# Patient Record
Sex: Male | Born: 1995 | Race: White | Hispanic: No | Marital: Single
Health system: Midwestern US, Community
[De-identification: ages and names within clinical notes are randomized; demographics above are authoritative.]

---
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
## 2014-01-15 MED ORDER — PSEUDOEPHEDRINE-GUAIFENESIN ER 60-600 MG PO TB12
60-600 | ORAL_TABLET | Freq: Two times a day (BID) | ORAL | Status: AC
Start: 2014-01-15 — End: 2014-01-25

## 2014-01-15 MED ORDER — AZITHROMYCIN 250 MG PO TABS
250 | PACK | ORAL | Status: DC
Start: 2014-01-15 — End: 2017-04-06

## 2014-01-15 NOTE — Progress Notes (Signed)
Subjective:      Patient ID: Brandon Huff is a 18 y.o. male.    HPI  Here for eval sxs over the past 2-3 days  Seems to be getting worse  + sinus drainage  + runny nose, stuffy nose  + h/a  + generalized weakness, tired  + ST  + PND  + sneezing    No cough  No dyspnea  No wheeze  No chest congestion    No ear sxs  No rash  No fever - but feels warm  No chills  Nl appetite    Sick contacts: roommate with bronchitis  Mgmt: Dayquil  + h/o allergies- spring  no h/o asthma  no h/o sinus dz/infection  Non-smoker    Review of Systems   Constitutional: Negative for fever, chills and fatigue.   HENT: Positive for postnasal drip, rhinorrhea, sinus pressure, sneezing and sore throat. Negative for ear pain, hearing loss, nosebleeds, trouble swallowing and voice change.    Eyes: Negative for discharge, redness and itching.   Respiratory: Negative for cough, chest tightness, shortness of breath and wheezing.    Cardiovascular: Negative for chest pain.   Gastrointestinal: Negative for nausea.   Endocrine:        Non-DM   Skin: Negative for rash.   Allergic/Immunologic: Positive for environmental allergies.   Neurological: Positive for headaches. Negative for dizziness and light-headedness.       Objective:   Physical Exam   Constitutional: He is oriented to person, place, and time. Vital signs are normal. He appears well-developed and well-nourished. He is active.  Non-toxic appearance. He appears ill. No distress.   HENT:   Head: Normocephalic and atraumatic.   Right Ear: Tympanic membrane, external ear and ear canal normal.   Left Ear: Tympanic membrane, external ear and ear canal normal.   Nose: Mucosal edema and rhinorrhea present. Right sinus exhibits no maxillary sinus tenderness and no frontal sinus tenderness. Left sinus exhibits no maxillary sinus tenderness and no frontal sinus tenderness.   Mouth/Throat: Uvula is midline. Posterior oropharyngeal erythema present. No oropharyngeal exudate or posterior oropharyngeal edema.    + PND   Eyes: Conjunctivae and lids are normal. No scleral icterus.   Neck: Neck supple. No thyroid mass and no thyromegaly present.   Cardiovascular: Normal rate, regular rhythm and normal heart sounds.    Pulmonary/Chest: Effort normal and breath sounds normal. He has no decreased breath sounds. He has no wheezes. He has no rhonchi. He has no rales.   Lymphadenopathy:     He has no cervical adenopathy.   Neurological: He is alert and oriented to person, place, and time.   Skin: Skin is warm, dry and intact. No rash noted.   Psychiatric: He has a normal mood and affect. His behavior is normal.   Nursing note and vitals reviewed.          Assessment:      1. Viral URI  pseudoephedrine-guaiFENesin (MUCINEX D) 60-600 MG per tablet   2. Acute sinusitis, recurrence not specified, unspecified location  azithromycin (ZITHROMAX Z-PAK) 250 MG tablet    Viral vs early bacterial           Plan:      Nasal saline sprays (ie: [place], [place])  Tylenol and/or ibuprofen prn pain  Antibiotics as prescribed if symptoms worsen or fail to improve           Elianne Gubser T Sarayah Bacchi        Follow up with  PCP in 3-5 days. Call for appointment with your current PCP or please set up to establish with new PCP ASAP for appropriate follow up.

## 2016-11-10 ENCOUNTER — Inpatient Hospital Stay: Admit: 2016-11-10 | Attending: Internal Medicine

## 2016-11-10 NOTE — Other (Unsigned)
Patient Acct Nbr: [id number]   Primary AUTH/CERT:   Primary Insurance Company Name: [employer]  Primary Insurance Plan name: [employer]  Primary Insurance Group Number:   Primary Insurance Plan Type: Doctor, hospitalLiability  Primary Insurance Policy Number: [id number]

## 2016-11-10 NOTE — Other (Unsigned)
Patient Acct Nbr: [id number]   Primary AUTH/CERT:   Primary Insurance Company Name: [employer]  Primary Insurance Plan name: [employer]  Primary Insurance Group Number:   Primary Insurance Plan Type: Doctor, hospitalLiability  Primary Insurance Policy Number: [id number]

## 2017-04-06 ENCOUNTER — Ambulatory Visit: Admit: 2017-04-06 | Discharge: 2017-04-06 | Payer: PRIVATE HEALTH INSURANCE | Attending: Family

## 2017-04-06 DIAGNOSIS — H6123 Impacted cerumen, bilateral: Secondary | ICD-10-CM

## 2017-04-06 MED ORDER — POLYMYXIN B-TRIMETHOPRIM 10000-0.1 UNIT/ML-% OP SOLN
Freq: Four times a day (QID) | OPHTHALMIC | 0 refills | Status: AC
Start: 2017-04-06 — End: 2017-04-13

## 2017-04-06 NOTE — Progress Notes (Signed)
Subjective:     Patient: Brandon Huff is a 21 y.o. male     Eye Problem    The left eye is affected. This is a new problem. The current episode started yesterday. The problem occurs constantly. The problem has been unchanged. There was no injury mechanism. There is no known exposure to pink eye. He does not wear contacts. Associated symptoms include an eye discharge, eye redness and itching. Pertinent negatives include no blurred vision, double vision, fever, foreign body sensation, photophobia, recent URI, vomiting or weakness. He has tried nothing for the symptoms.      Patient also presents to clinic with complaint of left ear fullness and decreased hearing. Patient stated he thinks his left ear may occluded as he has history of cerumen impactions.     Review of Systems   Constitutional: Positive for activity change. Negative for chills, fatigue and fever.   HENT: Positive for ear pain. Negative for congestion, ear discharge, sinus pain, sinus pressure, sore throat and trouble swallowing.    Eyes: Positive for discharge, redness and itching. Negative for blurred vision, double vision, photophobia, pain and visual disturbance.   Gastrointestinal: Negative for vomiting.   Musculoskeletal: Negative for myalgias and neck pain.   Skin: Negative for color change, pallor, rash and wound.   Allergic/Immunologic: Negative for environmental allergies, food allergies and immunocompromised state.   Neurological: Negative for dizziness and weakness.   Psychiatric/Behavioral: Negative for agitation.        No Known Allergies  No current outpatient prescriptions on file prior to visit.     No current facility-administered medications on file prior to visit.       No past medical history on file.   Social History   Substance Use Topics   . Smoking status: Never Smoker   . Smokeless tobacco: Never Used   . Alcohol use Not on file          Objective:     BP 108/73 (Site: Left Upper Arm)   Pulse 60   Temp 99 F (37.2 C)  (Temporal)   Wt 166 lb (75.3 kg)   SpO2 98%   BMI 24.51 kg/m     Physical Exam   Constitutional: He appears well-developed and well-nourished. No distress.   HENT:   Head: Normocephalic and atraumatic.   Right Ear: Hearing and external ear normal.   Left Ear: Decreased hearing is noted.   Left ear- canal is completely occluded and TM cannot be visualized.     Right ear- canal is partially occluded with cerumen, however portion of TM at approximately 10 o'clock is visible and appears normal in appearance.     Patient a candidate for a ear irrigation so that both TM's can be visualized and assess if patient is also suffering from an otitis media infection along with pink eye.     Eyes: Pupils are equal, round, and reactive to light. EOM and lids are normal. Right eye exhibits no discharge, no exudate and no hordeolum. No foreign body present in the right eye. Left eye exhibits discharge. Left eye exhibits no exudate and no hordeolum. No foreign body present in the left eye. Right conjunctiva is not injected. Right conjunctiva has no hemorrhage. Left conjunctiva is injected. Left conjunctiva has no hemorrhage. No scleral icterus.   Skin: He is not diaphoretic.   Nursing note and vitals reviewed.        Assessment      1. Bilateral impacted cerumen  2. Acute conjunctivitis of left eye, unspecified acute conjunctivitis type         Plan      1. Bilateral impacted cerumen    2. Acute conjunctivitis of left eye, unspecified acute conjunctivitis type  - trimethoprim-polymyxin b (POLYTRIM) 10000-0.1 UNIT/ML-% ophthalmic solution; Place 1 drop into the left eye every 6 hours for 7 days  Dispense: 1 Bottle; Refill: 0    Plan of care for this patient is to treat for cerumen impaction. After irrigation both TM's completely visualized. No signs of infection noted. Patient tolerated well and indicated his ear pain and fullness has resolved entirely. Patient will also be treated for conjunctivitis.    Patient will be given  script for polytrim eye drops.   Patient advised to drink fluids, get rest and take all meds as prescribed. Patient advised if symptoms worsen or persist, they are to follow up with PCP. Patient agreeable with treatment plan.       Roswell MinersJoseph Meloney Feld, APRN - CNP  04/06/17  4:41 PM        If symptoms do not improve, worsen, or new symptoms develop, see PCP for further evaluation.

## 2017-04-06 NOTE — Patient Instructions (Signed)
Patient Education        Pinkeye: Care Instructions  Your Care Instructions    Pinkeye is redness and swelling of the eye surface and the conjunctiva (the lining of the eyelid and the covering of the white part of the eye). Pinkeye is also called conjunctivitis. Pinkeye is often caused by infection with bacteria or a virus. Dry air, allergies, smoke, and chemicals are other common causes.  Pinkeye often clears on its own in 7 to 10 days. Antibiotics only help if the pinkeye is caused by bacteria. Pinkeye caused by infection spreads easily. If an allergy or chemical is causing pinkeye, it will not go away unless you can avoid whatever is causing it.  Follow-up care is a key part of your treatment and safety. Be sure to make and go to all appointments, and call your doctor if you are having problems. It's also a good idea to know your test results and keep a list of the medicines you take.  How can you care for yourself at home?   Wash your hands often. Always wash them before and after you treat pinkeye or touch your eyes or face.   Use moist cotton or a clean, wet cloth to remove crust. Wipe from the inside corner of the eye to the outside. Use a clean part of the cloth for each wipe.   Put cold or warm wet cloths on your eye a few times a day if the eye hurts.   Do not wear contact lenses or eye makeup until the pinkeye is gone. Throw away any eye makeup you were using when you got pinkeye. Clean your contacts and storage case. If you wear disposable contacts, use a new pair when your eye has cleared and it is safe to wear contacts again.   If the doctor gave you antibiotic ointment or eyedrops, use them as directed. Use the medicine for as long as instructed, even if your eye starts looking better soon. Keep the bottle tip clean, and do not let it touch the eye area.   To put in eyedrops or ointment:  ? Tilt your head back, and pull your lower eyelid down with one finger.  ? Drop or squirt the medicine  inside the lower lid.  ? Close your eye for 30 to 60 seconds to let the drops or ointment move around.  ? Do not touch the ointment or dropper tip to your eyelashes or any other surface.   Do not share towels, pillows, or washcloths while you have pinkeye.  When should you call for help?  Call your doctor now or seek immediate medical care if:    You have pain in your eye, not just irritation on the surface.     You have a change in vision or loss of vision.     You have an increase in discharge from the eye.     Your eye has not started to improve or begins to get worse within 48 hours after you start using antibiotics.     Pinkeye lasts longer than 7 days.   Watch closely for changes in your health, and be sure to contact your doctor if you have any problems.  Where can you learn more?  Go to [email or web address] and sign in to your MyChart account. Enter Y392 in the Search Health Information box to learn more about "Pinkeye: Care Instructions."     If you do not have an account, please click on the "Sign   Up Now" link.  Current as of: March 08, 2016  Content Version: 11.8   2006-2018 [employer]. Care instructions adapted under license by [facility]. If you have questions about a medical condition or this instruction, always ask your healthcare professional. [employer] disclaims any warranty or liability for your use of this information.         Patient Education        Earwax Blockage: Care Instructions  Your Care Instructions    Earwax is a natural substance that protects the ear canal. Normally, earwax drains from the ears and does not cause problems. Sometimes earwax builds up and hardens. Earwax blockage (also called cerumen impaction) can cause some loss of hearing and pain. When wax is tightly packed, you will need to have your doctor remove it.  Follow-up care is a key part of your treatment and safety. Be sure to make and go to all  appointments, and call your doctor if you are having problems. It's also a good idea to know your test results and keep a list of the medicines you take.  How can you care for yourself at home?   Do not try to remove earwax with cotton swabs, fingers, or other objects. This can make the blockage worse and damage the eardrum.   If your doctor recommends that you try to remove earwax at home:  ? Soften and loosen the earwax with warm mineral oil. You also can try hydrogen peroxide mixed with an equal amount of room temperature water. Place 2 drops of the fluid, warmed to body temperature, in the ear two times a day for up to 5 days.  ? Once the wax is loose and soft, all that is usually needed to remove it from the ear canal is a gentle, warm shower. Direct the water into the ear, then tip your head to let the earwax drain out. Dry your ear thoroughly with a hair dryer set on low. Hold the dryer several inches from your ear.  ? If the warm mineral oil and shower do not work, use an over-the-counter wax softener. Read and follow all instructions on the label. After using the wax softener, use an ear syringe to gently flush the ear. Make sure the flushing solution is body temperature. Cool or hot fluids in the ear can cause dizziness.  When should you call for help?  Call your doctor now or seek immediate medical care if:    Pus or blood drains from your ear.     Your ears are ringing or feel full.     You have a loss of hearing.   Watch closely for changes in your health, and be sure to contact your doctor if:    You have pain or reduced hearing after 1 week of home treatment.     You have any new symptoms, such as nausea or balance problems.   Where can you learn more?  Go to [email or web address] and sign in to your MyChart account. Enter [id number] in the Search Health Information box to learn more about "Earwax Blockage: Care Instructions."     If you do not have an account, please click on  the "Sign Up Now" link.  Current as of: March 08, 2016  Content Version: 11.8   2006-2018 [employer]. Care instructions adapted under license by [facility]. If you have questions about a medical condition or this instruction, always ask your healthcare professional. [employer] disclaims any warranty or liability  for your use of this information.

## 2019-04-14 DIAGNOSIS — R0789 Other chest pain: Secondary | ICD-10-CM

## 2019-04-14 NOTE — ED Provider Notes (Signed)
Emergency Department Encounter  [facility] EMERGENCY DEPT    Patient: Brandon Huff  MRN: [id number]  DOB: 1995-05-13  Date of Evaluation: 04/14/2019  ED Provider: Richelle Ito, PA-C    As the APP-in-triage, I performed a medical screening history and physical exam on this patient.  An N95 and gloves were worn during the entirety of this encounter.    HISTORY OF PRESENT ILLNESS  In brief, Brandon Huff is a 23 y.o. male that presents for left-sided chest pain that started one day ago.  Patient states he was lying on the couch and felt a sharp, left-sided pain that lasted for a few seconds.  Pain is now dull, intermittent.  Initially attributed to heartburn, however members that he did fall while snowboarding the other day.  Denies shortness of breath, fever, chills, cough.  No cardiac or lung history.  No history of blood clots.      PHYSICAL EXAM  ED Triage Vitals [04/14/19 2023]   Enc Vitals Group      BP       Pulse       Resp 16      Temp       Temp src       SpO2       Weight 170 lb (77.1 kg)      Height 5\' 9"  (1.753 m)      Head Circumference       Peak Flow       Pain Score       Pain Loc       Pain Edu?       Excl. in [place]?        On brief exam, patient alert and oriented ??3, well-appearing began full, clear sentences in no distress.  Heart is regular rate and rhythm.  Lungs are clear to auscultation bilaterally.  Unable to reproduce chest wall pain.      We will initiate diagnostics/treatments as indicated and place in main ED as soon as available.    Darrol Jump)  Richelle Ito, PA-C  [employer]       Luciano Cinquemani, [place]  04/14/19 2138

## 2019-04-14 NOTE — ED Provider Notes (Signed)
[facility] EMERGENCY DEPT  EMERGENCY DEPARTMENT ENCOUNTER      Pt Name: Brandon Huff  MRN: [id number]  Birthdate 03-06-1996  Date of evaluation: 04/14/2019  Provider: Elmer Picker, PA-C     CHIEF COMPLAINT       Chief Complaint   Patient presents with   ??? Chest Pain     pt c/o left sided chest pressure x 1 day, states it felt like heartburn but is not improving. pt states he did fall while snowboarding on wednesday, pain is not reproducable.          HISTORY OF PRESENT ILLNESS   (Location/Symptom, Timing/Onset, Context/Setting, Quality, Duration, Modifying Factors, Severity) Note limiting factors.   HPI    I wore an N-95 mask with a surgical mask and goggles during my entire encounter.     Brandon Huff is a 23 y.o. male who presents to the emergency department  For evaluation of left-sided chest pressure for one day.  Patient states that yesterday he is lying on the couch and felt a sharp left-sided pain that lasted a few seconds and now has been a dull pain and intermittent since yesterday.  Patient states he thought it was heartburn but also had a mechanical fall while snowboarding on Wednesday.  Patient denies any head injuries or trauma.  Patient has is in consciousness.  Patient has a significant history of lung disease denies tobacco use and marijuana use.  Patient states the pain is not reproducible but has been intermittent since yesterday.  Patient states he has not taken anything for pain or heartburn.   Patient denies significant past medical history.  Patient states that he is overall healthy and works out.  Patient denies family history of heart disease.  Patient denies, chills, lightheadedness, dizziness, shortness of breath, cough, chest tightness, abdominal pain, nausea, vomiting, diarrhea, dysuria, hematuria, numbness or tingling in extremities, rash, or lower extremity edema.    Nursing Notes were reviewed.    REVIEW OF SYSTEMS    (2+ for level 4; 10+ for level 5)   At least 10 systems reviewed and otherwise  acutely negative except as in the HOPI.    PAST MEDICAL HISTORY   History reviewed. No pertinent past medical history.    SURGICAL HISTORY     History reviewed. No pertinent surgical history.    CURRENT MEDICATIONS       Previous Medications    No medications on file       ALLERGIES     Patient has no known allergies.    FAMILY HISTORY     History reviewed. No pertinent family history.     SOCIAL HISTORY       Social History     Socioeconomic History   ??? Marital status: Single     Spouse name: None   ??? Number of children: None   ??? Years of education: None   ??? Highest education level: None   Occupational History   ??? None   Social Needs   ??? Financial resource strain: None   ??? Food insecurity     Worry: None     Inability: None   ??? Transportation needs     Medical: None     Non-medical: None   Tobacco Use   ??? Smoking status: Never Smoker   ??? Smokeless tobacco: Never Used   Substance and Sexual Activity   ??? Alcohol use: Yes     Alcohol/week: 0.0 standard drinks  Comment: social   ??? Drug use: Never   ??? Sexual activity: None   Lifestyle   ??? Physical activity     Days per week: None     Minutes per session: None   ??? Stress: None   Relationships   ??? Social [employer]     Talks on phone: None     Gets together: None     Attends religious service: None     Active member of club or organization: None     Attends meetings of clubs or organizations: None     Relationship status: None   ??? Intimate partner violence     Fear of current or ex partner: None     Emotionally abused: None     Physically abused: None     Forced sexual activity: None   Other Topics Concern   ??? None   Social History Narrative   ??? None       SCREENINGS      Heart Score for chest pain patients  History: Slightly Suspicious  ECG: Normal  Patient Age: < 45 years  Risk Factors: No risk factors known  Troponin: < 1X normal limit  Heart Score Total: 0    PHYSICAL EXAM    (up to 7 for level 4, 8 or more for level 5)     ED Triage Vitals   BP Temp Temp Source  Pulse Resp SpO2 Height Weight   04/14/19 2026 04/14/19 2026 04/14/19 2026 04/14/19 2026 04/14/19 2023 04/14/19 2026 04/14/19 2023 04/14/19 2023   (!) 136/57 98.4 ??F (36.9 ??C) Temporal 58 16 99 % 5\' 9"  (1.753 m) 170 lb (77.1 kg)       Physical Exam  Vitals signs and nursing note reviewed.   Constitutional:       Appearance: Normal appearance.   HENT:      Head: Normocephalic and atraumatic.      Nose: Nose normal. No congestion or rhinorrhea.      Mouth/Throat:      Mouth: Mucous membranes are moist.      Pharynx: Oropharynx is clear. No oropharyngeal exudate or posterior oropharyngeal erythema.   Eyes:      General: No visual field deficit.     Extraocular Movements: Extraocular movements intact.      Conjunctiva/sclera: Conjunctivae normal.      Pupils: Pupils are equal, round, and reactive to light.   Neck:      Musculoskeletal: Normal range of motion and neck supple. No neck rigidity or muscular tenderness.      Vascular: No carotid bruit.   Cardiovascular:      Rate and Rhythm: Regular rhythm. Bradycardia present.      Pulses: Normal pulses.      Heart sounds: Normal heart sounds. No murmur. No friction rub. No gallop.    Pulmonary:      Effort: Pulmonary effort is normal. No respiratory distress.      Breath sounds: Normal breath sounds. No wheezing or rhonchi.   Chest:      Chest wall: No tenderness.   Abdominal:      General: Abdomen is flat. Bowel sounds are normal. There is no distension.      Palpations: Abdomen is soft.      Tenderness: There is no abdominal tenderness. There is no guarding or rebound.   Musculoskeletal: Normal range of motion.         General: No swelling or tenderness.      Right  lower leg: No edema.      Left lower leg: No edema.   Lymphadenopathy:      Cervical: No cervical adenopathy.   Skin:     General: Skin is warm and dry.      Capillary Refill: Capillary refill takes less than 2 seconds.   Neurological:      General: No focal deficit present.      Mental Status: He is alert and  oriented to person, place, and time.      Cranial Nerves: Cranial nerves are intact. No cranial nerve deficit, dysarthria or facial asymmetry.      Sensory: Sensation is intact. No sensory deficit.      Motor: Motor function is intact. No weakness.      Coordination: Coordination is intact. Coordination normal.      Gait: Gait is intact. Gait normal.      Deep Tendon Reflexes: Reflexes are normal and symmetric.      Reflex Scores:       Patellar reflexes are 2+ on the right side and 2+ on the left side.       Achilles reflexes are 2+ on the right side and 2+ on the left side.  Psychiatric:         Mood and Affect: Mood normal.         Behavior: Behavior normal.         Thought Content: Thought content normal.         DIAGNOSTIC RESULTS     EKG:  All EKG's are interpreted by the Emergency Department Physician in the absence of a cardiologist.  Please see their note for interpretation of EKG.    RADIOLOGY (Per Emergency Physician):   Interpretation per the Radiologist below, if available at the time of this note:  Xr Chest (2 Vw)    Result Date: 04/14/2019  Patient Name:                ZYQUAN, CROTTY MRN:                         [id number] Acct#:                       [id number] Diagnostic Radiology ACCESSION                 EXAM DATE/TIME           PROCEDURE                 ORDERING PROVIDER [phone]             04/14/2019 21:08 EST     CR Chest PA & LAT         HAVERCHAK, ALEXANDRA CPT code 25956 Reason For Exam (CR Chest PA & LAT) cp Report CHEST PA AND LATERAL CLINICAL INDICATION: cp TECHNIQUE: Frontal and lateral chest x-ray. COMPARISON: 25 July, 2018. FINDINGS: Cardiac and mediastinal silhouette within normal limits. Lungs are grossly clear. No significant vascular congestion. No apparent pleural effusion or pneumothorax. Bony thorax grossly unremarkable. IMPRESSION: 1. No acute findings. Report Dictated on Workstation: [id number] ---** Final ---** Dictated: 04/14/2019 9:15 pm Dictating Physician: Georgetta Haber, MD,  [facility] Signed Date and Time: 04/14/2019 9:15 pm Signed by: Georgetta Haber, MD, [facility] Transcribed Date and Time: 04/14/2019 9:15      ED BEDSIDE ULTRASOUND:   Performed by ED Physician - none    LABS:  Labs Reviewed - No data  to display     All other labs were within normal range or not returned as of this dictation.    EMERGENCY DEPARTMENT COURSE and DIFFERENTIAL DIAGNOSIS/MDM:   Vitals:    Vitals:    04/14/19 2023 04/14/19 2026   BP:  (!) 136/57   Pulse:  58   Resp: 16 16   Temp:  98.4 ??F (36.9 ??C)   TempSrc:  Temporal   SpO2:  99%   Weight: 77.1 kg (170 lb)    Height: 5\' 9"  (1.753 m)        Medications   calcium carbonate (TUMS) chewable tablet 500 mg (has no administration in time range)   lidocaine 4 % external patch 1 patch (1 patch Transdermal Patch Applied 04/14/19 2108)       I independently evaluated the patient with supervising attending physician available as needed for collaboration.    In brief, JOHNROBERT FOTI is a 23 y.o. male who presented to the emergency department for evaluation of left-sided chest pain that started yesterday has been intermittent throughout the day is nonradiating.  Patient states that has not gotten worse that he just wanted to make sure everything was okay.   EMR charts, recent hospitalizations, nursing chart reviewed.     Viitals are stable, patient heart rate is 59 bpm and regular rhythm. Lungs are clear to auscultation bilaterally.  Abdomen soft, nondistended, nontender.  Patient has no reducible left fall tenderness. Radial pulses are equal and 2 + bilaterally.  Patient is able to ambulate with a steady gait.  Otherwise physical examination is benign, see above further details.     I considered acute coronary syndrome, aortic dissection, pericarditis or pericardial tamponade, pulmonary embolism, pneumothorax, esophageal rupture, pneumonia.  Patient's symptoms most likely heartburn versus muscular skeletal.  Patient is given Tums and a Lidoderm patch. Electrocardiogram demonstrates  sinus bradycardia at 59 bpm.  No T-wave changes or inversions.  No ST elevation.  Patient's heart score is a 0.  he denies family history of heart disease, family members passing at young ages or drowning.  Patient is advised to follow up with PCP  Within 2 days for reevaluation for the examination.  Patient is advised to take Tums as needed for heartburn and given Lidoderm patches to take as needed for  The pain to his chest.  Patient advised to take naproxen or tylenol as needed for symptomatic relief as well.  With sugar decision making we agree with no blood work or further imaging at this time, patient denies any recent travel or history of blood clots.  Patient has family history of blood clots. Low suspicion for PE.  No new for troponin at this time, patient is a 23 year old young and healthy male in no significant risk factors for ACS.  Chest x-ray was obtained and demonstrated  No acute  Abnormalities or acute findings.     The patient and I have discussed the diagnosis and risks, and we agree with discharging home with close follow-up. We also discussed returning to the Emergency Department immediately if new or worsening symptoms occur. We have discussed the symptoms which are most concerning that necessitate immediate return. Questions sought and answered with the patient and strict return provided. He voice understanding and agree with plan. Patient discharged in stable condition.         CONSULTS:  None    PROCEDURES:  Unless otherwise noted below, none     Procedures    Final Impression  1. Chest wall pain    2. Chest pain, unspecified type        DISPOSITION        (Please note that portions of this note may have been completed with a voice recognition program.Efforts were made to edit the dictations but occasionally words are mis-transcribed.)    Elmer Picker, PA-C  [employer]        Jearld Adjutant, PA-C  04/14/19 2123

## 2019-04-14 NOTE — ED Notes (Signed)
Bed: 08  Expected date:   Expected time:   Means of arrival:   Comments:  Triage      Ames Coupe, RN  04/14/19 2030

## 2019-04-14 NOTE — Discharge Instructions (Signed)
Follow up with PCP within 3 days for evaluation for the examination.  Return to the Emergency Department if symptoms worsen or do not improve.  Continue to take Tylenol or Aleve as needed for symptomatic relief.

## 2019-04-15 ENCOUNTER — Inpatient Hospital Stay: Admit: 2019-04-15 | Discharge: 2019-04-15 | Disposition: A | Discharge status: Home / Self Care

## 2019-04-15 MED ORDER — CALCIUM CARBONATE ANTACID 500 MG PO CHEW
500 MG | Freq: Three times a day (TID) | ORAL | Status: DC | PRN
Start: 2019-04-15 — End: 2019-04-14
  Administered 2019-04-15: 03:00:00 500 mg via ORAL

## 2019-04-15 MED ORDER — LIDOCAINE 5 % EX PTCH
5 % | MEDICATED_PATCH | Freq: Every day | CUTANEOUS | 0 refills | Status: AC
Start: 2019-04-15 — End: 2019-04-24

## 2019-04-15 MED ORDER — LIDOCAINE 4 % EX PTCH
4 % | Freq: Once | CUTANEOUS | Status: DC
Start: 2019-04-15 — End: 2019-04-14
  Administered 2019-04-15: 02:00:00 1 via TRANSDERMAL

## 2019-04-15 MED FILL — CALCIUM ANTACID 500 MG PO CHEW: 500 mg | ORAL | Qty: 1

## 2019-04-15 MED FILL — ASPERCREME LIDOCAINE 4 % EX PTCH: 4 % | CUTANEOUS | Qty: 1
